# Patient Record
Sex: Male | Born: 1985 | Race: Black or African American | Hispanic: No | Marital: Married | Smoking: Never smoker
Health system: Southern US, Community
[De-identification: ages and names within clinical notes are randomized; demographics above are authoritative.]

## PROBLEM LIST (undated history)

## (undated) HISTORY — PX: TONSILLECTOMY: SUR1361

---

## 2004-11-16 ENCOUNTER — Encounter: Admission: RE | Admit: 2004-11-16 | Discharge: 2004-11-16 | Payer: Self-pay | Admitting: Occupational Medicine

## 2013-02-06 ENCOUNTER — Other Ambulatory Visit (HOSPITAL_COMMUNITY)
Admission: RE | Admit: 2013-02-06 | Discharge: 2013-02-06 | Disposition: A | Discharge status: Home / Self Care | Payer: Self-pay | Source: Ambulatory Visit | Attending: Family Medicine | Admitting: Family Medicine

## 2013-02-06 ENCOUNTER — Encounter (HOSPITAL_COMMUNITY): Payer: Self-pay | Admitting: *Deleted

## 2013-02-06 ENCOUNTER — Emergency Department (INDEPENDENT_AMBULATORY_CARE_PROVIDER_SITE_OTHER)
Admission: EM | Admit: 2013-02-06 | Discharge: 2013-02-06 | Disposition: A | Discharge status: Home / Self Care | Payer: Self-pay | Source: Home / Self Care

## 2013-02-06 DIAGNOSIS — N342 Other urethritis: Secondary | ICD-10-CM

## 2013-02-06 DIAGNOSIS — R319 Hematuria, unspecified: Secondary | ICD-10-CM

## 2013-02-06 DIAGNOSIS — Z113 Encounter for screening for infections with a predominantly sexual mode of transmission: Secondary | ICD-10-CM | POA: Insufficient documentation

## 2013-02-06 LAB — POCT URINALYSIS DIP (DEVICE)
Glucose, UA: NEGATIVE mg/dL
Nitrite: NEGATIVE
Urobilinogen, UA: 1 mg/dL (ref 0.0–1.0)

## 2013-02-06 MED ORDER — AZITHROMYCIN 250 MG PO TABS
1000.0000 mg | ORAL_TABLET | Freq: Once | ORAL | Status: AC
  Administered 2013-02-06: 1000 mg via ORAL

## 2013-02-06 MED ORDER — CEFTRIAXONE SODIUM 250 MG IJ SOLR
250.0000 mg | Freq: Once | INTRAMUSCULAR | Status: AC
  Administered 2013-02-06: 250 mg via INTRAMUSCULAR

## 2013-02-06 MED ORDER — CEFTRIAXONE SODIUM 250 MG IJ SOLR
INTRAMUSCULAR | Status: AC
  Filled 2013-02-06: qty 250

## 2013-02-06 MED ORDER — AZITHROMYCIN 250 MG PO TABS
ORAL_TABLET | ORAL | Status: AC
  Filled 2013-02-06: qty 4

## 2013-02-06 NOTE — ED Provider Notes (Signed)
   History    CSN: [id number] Arrival date & time 02/06/13  1012  First MD Initiated Contact with Patient 02/06/13 1059     Chief Complaint  Patient presents with  . Hematuria   (Consider location/radiation/quality/duration/timing/severity/associated sxs/prior Treatment) HPI  27 yo bm presents today with complaints of hematuria.  States that 2 weeks ago he started having dysuria, hematuria, and some yellowish colored penile discharge.  No discharge over the last week or so.  Has had some lower abd discomfort but this is somewhat improved.  Denies fever, chills, joint pain, weakness, fatigue, testicular pain.   History reviewed. No pertinent past medical history. History reviewed. No pertinent past surgical history. History reviewed. No pertinent family history. History  Substance Use Topics  . Smoking status: Not on file  . Smokeless tobacco: Not on file  . Alcohol Use: Yes    Review of Systems  Constitutional: Negative.   HENT: Negative.   Eyes: Negative.   Cardiovascular: Negative.   Endocrine: Negative.   Genitourinary: Positive for dysuria, frequency, hematuria and discharge. Negative for urgency, flank pain, penile swelling, scrotal swelling, genital sores, penile pain and testicular pain.  Musculoskeletal: Negative.   Skin: Negative.   Neurological: Negative.   Hematological: Negative.   Psychiatric/Behavioral: Negative.     Allergies  Review of patient's allergies indicates no known allergies.  Home Medications  No current outpatient prescriptions on file. BP 133/95  Pulse 65  Temp(Src) 98.8 F (37.1 C) (Oral)  Resp 18  SpO2 100% Physical Exam  Constitutional: He is oriented to person, place, and time. He appears well-developed and well-nourished.  HENT:  Head: Normocephalic and atraumatic.  Eyes: EOM are normal. Pupils are equal, round, and reactive to light.  Neck: Normal range of motion.  Cardiovascular: Normal rate and regular rhythm.    Pulmonary/Chest: Effort normal and breath sounds normal.  Abdominal: Soft. Bowel sounds are normal. There is no tenderness. There is no rebound and no guarding.  Genitourinary: Penis normal. No penile tenderness.  Musculoskeletal: Normal range of motion.  Neurological: He is alert and oriented to person, place, and time.  Skin: Skin is warm and dry.  Psychiatric: He has a normal mood and affect.    ED Course  Procedures (including critical care time) Labs Reviewed  POCT URINALYSIS DIP (DEVICE) - Abnormal; Notable for the following:    Hgb urine dipstick MODERATE (*)    Protein, ur 100 (*)    Leukocytes, UA SMALL (*)    All other components within normal limits  URINE CYTOLOGY ANCILLARY ONLY   No results found. No diagnosis found.  Dx:  urethrits MDM  Will treat patient for urethritis.  Cultures sent.   Instructions given.  Return 3-5 days if not resolved.    Meds ordered this encounter  Medications  . cefTRIAXone (ROCEPHIN) injection 250 mg    Sig:   . azithromycin (ZITHROMAX) tablet 1,000 mg    Sig:     Zonia Kief, PA-C 02/08/13 1521

## 2013-02-06 NOTE — ED Notes (Signed)
Pt  Reports  He  Noticed  Some   Blood  In  Urine     X  2  Weeks           With  Some low  abd  Pain  And  intermittant       Discharge              At  This  Time       Pt  denys  Any  Pain

## 2013-02-07 LAB — URINE CULTURE

## 2013-02-09 ENCOUNTER — Telehealth (HOSPITAL_COMMUNITY): Payer: Self-pay | Admitting: Surgery

## 2013-02-09 NOTE — ED Provider Notes (Signed)
Medical screening examination/treatment/procedure(s) were performed by a resident physician or non-physician practitioner and as the supervising physician I was immediately available for consultation/collaboration.  Clementeen Graham, MD   Rodolph Bong, MD 02/09/13 [phone]

## 2013-02-09 NOTE — ED Notes (Addendum)
GC and Chlamydia pos., Urine culture: No growth.  Pt. adequately treated with Rocephin and Zithromax.  I called pt. and left a message to call. DHHS forms x 2 completed and faxed to the [facility].  Vassie Moselle 02/09/2013

## 2013-02-10 NOTE — ED Notes (Signed)
Call from patient. After verifying ID, discussed positive GC and chlamydia findings . Discussed he should advise his partner(s) of infection and need to be treated . Verbalized understanding of instructions

## 2013-02-11 ENCOUNTER — Telehealth (HOSPITAL_COMMUNITY): Payer: Self-pay | Admitting: *Deleted

## 2013-02-12 ENCOUNTER — Telehealth (HOSPITAL_COMMUNITY): Payer: Self-pay | Admitting: *Deleted

## 2013-02-12 NOTE — ED Notes (Signed)
I called pt. and told him I was calling about his lab results. He said he called back and got the results.  Pt. instructed to notify his partner, no sex for 1 week and to practice safe sex. Pt. told he can get HIV testing at the [facility]. STD clinic by appointment. Vassie Moselle 02/12/2013

## 2014-05-13 ENCOUNTER — Emergency Department (HOSPITAL_COMMUNITY): Payer: 59

## 2014-05-13 ENCOUNTER — Emergency Department (HOSPITAL_COMMUNITY)
Admission: EM | Admit: 2014-05-13 | Discharge: 2014-05-13 | Disposition: A | Discharge status: Home / Self Care | Payer: 59 | Attending: Emergency Medicine | Admitting: Emergency Medicine

## 2014-05-13 ENCOUNTER — Encounter (HOSPITAL_COMMUNITY): Payer: Self-pay | Admitting: Emergency Medicine

## 2014-05-13 DIAGNOSIS — J4520 Mild intermittent asthma, uncomplicated: Secondary | ICD-10-CM | POA: Diagnosis not present

## 2014-05-13 DIAGNOSIS — R079 Chest pain, unspecified: Secondary | ICD-10-CM | POA: Diagnosis present

## 2014-05-13 DIAGNOSIS — J22 Unspecified acute lower respiratory infection: Secondary | ICD-10-CM

## 2014-05-13 LAB — BASIC METABOLIC PANEL
Anion gap: 11 (ref 5–15)
BUN: 13 mg/dL (ref 6–23)
CHLORIDE: 104 meq/L (ref 96–112)
CO2: 25 meq/L (ref 19–32)
Calcium: 9 mg/dL (ref 8.4–10.5)
Creatinine, Ser: 1.14 mg/dL (ref 0.50–1.35)
GFR calc non Af Amer: 85 mL/min — ABNORMAL LOW (ref >90)
GLUCOSE: 91 mg/dL (ref 70–99)
POTASSIUM: 4.1 meq/L (ref 3.7–5.3)
Sodium: 140 mEq/L (ref 137–147)

## 2014-05-13 LAB — CBC
HCT: 36.1 % — ABNORMAL LOW (ref 39.0–52.0)
HEMOGLOBIN: 11.2 g/dL — AB (ref 13.0–17.0)
MCH: 27.1 pg (ref 26.0–34.0)
MCHC: 31 g/dL (ref 30.0–36.0)
MCV: 87.4 fL (ref 78.0–100.0)
PLATELETS: 316 10*3/uL (ref 150–400)
RBC: 4.13 MIL/uL — AB (ref 4.22–5.81)
RDW: 13.9 % (ref 11.5–15.5)
WBC: 8.1 10*3/uL (ref 4.0–10.5)

## 2014-05-13 LAB — I-STAT TROPONIN, ED: Troponin i, poc: 0 ng/mL (ref 0.00–0.08)

## 2014-05-13 MED ORDER — METHYLPREDNISOLONE SODIUM SUCC 125 MG IJ SOLR: 125.0000 mg | Freq: Once | INTRAMUSCULAR | Status: DC

## 2014-05-13 MED ORDER — ALBUTEROL SULFATE HFA 108 (90 BASE) MCG/ACT IN AERS: 1.0000 | INHALATION_SPRAY | RESPIRATORY_TRACT | Status: DC

## 2014-05-13 MED ORDER — ALBUTEROL (5 MG/ML) CONTINUOUS INHALATION SOLN
10.0000 mg/h | INHALATION_SOLUTION | RESPIRATORY_TRACT | Status: DC
  Filled 2014-05-13: qty 20

## 2014-05-13 NOTE — Discharge Instructions (Signed)
Read the instructions below on reasons to return to the emergency department and to learn more about your diagnosis.  Use over the counter medications for symptomatic relief as we discussed (Nyquil/Dayquil)  Your more than welcome to return to the emergency department if symptoms worsen or become concerning.  Upper Respiratory Infection, Adult  An upper respiratory infection (URI) is also sometimes known as the common cold. Most people improve within 1 week, but symptoms can last up to 2 weeks. A residual cough may last even longer.   URI is most commonly caused by a virus. Viruses are NOT treated with antibiotics. You can easily spread the virus to others by oral contact. This includes kissing, sharing a glass, coughing, or sneezing. Touching your mouth or nose and then touching a surface, which is then touched by another person, can also spread the virus.   TREATMENT  Treatment is directed at relieving symptoms. There is no cure. Antibiotics are not effective, because the infection is caused by a virus, not by bacteria. Treatment may include:  Increased fluid intake. Sports drinks offer valuable electrolytes, sugars, and fluids.  Breathing heated mist or steam (vaporizer or shower).  Eating chicken soup or other clear broths, and maintaining good nutrition.  Getting plenty of rest.  Using gargles or lozenges for comfort.  Controlling fevers with ibuprofen or acetaminophen as directed by your caregiver.  Increasing usage of your inhaler if you have asthma.  Return to work when your temperature has returned to normal.   SEEK MEDICAL CARE IF:  After the first few days, you feel you are getting worse rather than better.  You develop worsening shortness of breath, or brown or red sputum. These may be signs of pneumonia.  You develop yellow or brown nasal discharge or pain in the face, especially when you bend forward. These may be signs of sinusitis.  You develop a fever, swollen neck glands, pain  with swallowing, or white areas in the back of your throat. These may be signs of strep throat.    Bronchospasm A bronchospasm is a spasm or tightening of the airways going into the lungs. During a bronchospasm breathing becomes more difficult because the airways get smaller. When this happens there can be coughing, a whistling sound when breathing (wheezing), and difficulty breathing. Bronchospasm is often associated with asthma, but not all patients who experience a bronchospasm have asthma. CAUSES  A bronchospasm is caused by inflammation or irritation of the airways. The inflammation or irritation may be triggered by:   Allergies (such as to animals, pollen, food, or mold). Allergens that cause bronchospasm may cause wheezing immediately after exposure or many hours later.   Infection. Viral infections are believed to be the most common cause of bronchospasm.   Exercise.   Irritants (such as pollution, cigarette smoke, strong odors, aerosol sprays, and paint fumes).   Weather changes. Winds increase molds and pollens in the air. Rain refreshes the air by washing irritants out. Cold air may cause inflammation.   Stress and emotional upset.  SIGNS AND SYMPTOMS   Wheezing.   Excessive nighttime coughing.   Frequent or severe coughing with a simple cold.   Chest tightness.   Shortness of breath.  DIAGNOSIS  Bronchospasm is usually diagnosed through a history and physical exam. Tests, such as chest X-rays, are sometimes done to look for other conditions. TREATMENT   Inhaled medicines can be given to open up your airways and help you breathe. The medicines can be given using  either an inhaler or a nebulizer machine.  Corticosteroid medicines may be given for severe bronchospasm, usually when it is associated with asthma. HOME CARE INSTRUCTIONS   Always have a plan prepared for seeking medical care. Know when to call your health care provider and local emergency  services (911 in the [place] Know where you can access local emergency care.  Only take medicines as directed by your health care provider.  If you were prescribed an inhaler or nebulizer machine, ask your health care provider to explain how to use it correctly. Always use a spacer with your inhaler if you were given one.  It is necessary to remain calm during an attack. Try to relax and breathe more slowly.  Control your home environment in the following ways:   Change your heating and air conditioning filter at least once a month.   Limit your use of fireplaces and wood stoves.  Do not smoke and do not allow smoking in your home.   Avoid exposure to perfumes and fragrances.   Get rid of pests (such as roaches and mice) and their droppings.   Throw away plants if you see mold on them.   Keep your house clean and dust free.   Replace carpet with wood, tile, or vinyl flooring. Carpet can trap dander and dust.   Use allergy-proof pillows, mattress covers, and box spring covers.   Wash bed sheets and blankets every week in hot water and dry them in a dryer.   Use blankets that are made of polyester or cotton.   Wash hands frequently. SEEK MEDICAL CARE IF:   You have muscle aches.   You have chest pain.   The sputum changes from clear or white to yellow, green, gray, or bloody.   The sputum you cough up gets thicker.   There are problems that may be related to the medicine you are given, such as a rash, itching, swelling, or trouble breathing.  SEEK IMMEDIATE MEDICAL CARE IF:   You have worsening wheezing and coughing even after taking your prescribed medicines.   You have increased difficulty breathing.   You develop severe chest pain. MAKE SURE YOU:   Understand these instructions.  Will watch your condition.  Will get help right away if you are not doing well or get worse. Document Released: 07/13/2003 Document Revised: 07/15/2013 Document  Reviewed: 12/30/2012 [facility] Patient Information 2015 [employer], [place]. This information is not intended to replace advice given to you by your health care provider. Make sure you discuss any questions you have with your health care provider.     Albuterol inhalation aerosol What is this medicine? ALBUTEROL (al [address] ole) is a bronchodilator. It helps open up the airways in your lungs to make it easier to breathe. This medicine is used to treat and to prevent bronchospasm. This medicine may be used for other purposes; ask your health care provider or pharmacist if you have questions. COMMON BRAND NAME(S): Proair HFA, Proventil, Proventil HFA, Respirol, Ventolin, Ventolin HFA What should I tell my health care provider before I take this medicine? They need to know if you have any of the following conditions: -diabetes -heart disease or irregular heartbeat -high blood pressure -pheochromocytoma -seizures -thyroid disease -an unusual or allergic reaction to albuterol, levalbuterol, sulfites, other medicines, foods, dyes, or preservatives -pregnant or trying to get pregnant -breast-feeding How should I use this medicine? This medicine is for inhalation through the mouth. Follow the directions on your prescription label. Take  your medicine at regular intervals. Do not use more often than directed. Make sure that you are using your inhaler correctly. Ask you doctor or health care provider if you have any questions. Talk to your pediatrician regarding the use of this medicine in children. Special care may be needed. Overdosage: If you think you have taken too much of this medicine contact a poison control center or emergency room at once. NOTE: This medicine is only for you. Do not share this medicine with others. What if I miss a dose? If you miss a dose, use it as soon as you can. If it is almost time for your next dose, use only that dose. Do not use double or extra doses. What may  interact with this medicine? -anti-infectives like chloroquine and pentamidine -caffeine -cisapride -diuretics -medicines for colds -medicines for depression or for emotional or psychotic conditions -medicines for weight loss including some herbal products -methadone -some antibiotics like clarithromycin, erythromycin, levofloxacin, and linezolid -some heart medicines -steroid hormones like dexamethasone, cortisone, hydrocortisone -theophylline -thyroid hormones This list may not describe all possible interactions. Give your health care provider a list of all the medicines, herbs, non-prescription drugs, or dietary supplements you use. Also tell them if you smoke, drink alcohol, or use illegal drugs. Some items may interact with your medicine. What should I watch for while using this medicine? Tell your doctor or health care professional if your symptoms do not improve. Do not use extra albuterol. If your asthma or bronchitis gets worse while you are using this medicine, call your doctor right away. If your mouth gets dry try chewing sugarless gum or sucking hard candy. Drink water as directed. What side effects may I notice from receiving this medicine? Side effects that you should report to your doctor or health care professional as soon as possible: -allergic reactions like skin rash, itching or hives, swelling of the face, lips, or tongue -breathing problems -chest pain -feeling faint or lightheaded, falls -high blood pressure -irregular heartbeat -fever -muscle cramps or weakness -pain, tingling, numbness in the hands or feet -vomiting Side effects that usually do not require medical attention (report to your doctor or health care professional if they continue or are bothersome): -cough -difficulty sleeping -headache -nervousness or trembling -stomach upset -stuffy or runny nose -throat irritation -unusual taste This list may not describe all possible side effects. Call  your doctor for medical advice about side effects. You may report side effects to FDA at 1-800-FDA-1088. Where should I keep my medicine? Keep out of the reach of children. Store at room temperature between 15 and 30 degrees C (59 and 86 degrees F). The contents are under pressure and may burst when exposed to heat or flame. Do not freeze. This medicine does not work as well if it is too cold. Throw away any unused medicine after the expiration date. Inhalers need to be thrown away after the labeled number of puffs have been used or by the expiration date; whichever comes first. Ventolin HFA should be thrown away 12 months after removing from foil pouch. Check the instructions that come with your medicine. NOTE: This sheet is a summary. It may not cover all possible information. If you have questions about this medicine, talk to your doctor, pharmacist, or health care provider.  2015, [employer]. (2012-12-26 10:57:17)

## 2014-05-13 NOTE — ED Provider Notes (Signed)
CSN: [id number]     Arrival date & time 05/13/14  1656 History   First MD Initiated Contact with Patient 05/13/14 1846     Chief Complaint  Patient presents with  . Chest Pain     (Consider location/radiation/quality/duration/timing/severity/associated sxs/prior Treatment) HPI  Brendan Moreno is a(n) 28 y.o. male who presents with cc of chest pain with cough. He statest that he noticed a burning sensation with inhalation the other day. He has a progressively worsening SOB over the past 4 days. He has a productive cough. And chest pain with cough. He denies a hx of asthma or smoking. He c/o severe paroxysms of cough that are worse at night. He denies fevers, chills, myalgias or malaise. He has taken nothing over the counter to treat his symptoms. He describes his shortness of breath as tightness with cough.  History reviewed. No pertinent past medical history. Past Surgical History  Procedure Laterality Date  . Tonsillectomy     No family history on file. History  Substance Use Topics  . Smoking status: Never Smoker   . Smokeless tobacco: Not on file  . Alcohol Use: Yes    Review of Systems  Ten systems reviewed and are negative for acute change, except as noted in the HPI.    Allergies  Review of patient's allergies indicates no known allergies.  Home Medications   Prior to Admission medications   Not on File   BP 130/70  Pulse 88  Temp(Src) 98.7 F (37.1 C)  Resp 21  Ht 5\' 7"  (1.702 m)  Wt 245 lb (111.131 kg)  BMI 38.36 kg/m2  SpO2 98% Physical Exam  Nursing note and vitals reviewed. Constitutional: He appears well-developed and well-nourished. No distress.  HENT:  Head: Normocephalic and atraumatic.  Eyes: Conjunctivae are normal. No scleral icterus.  Neck: Normal range of motion. Neck supple.  Cardiovascular: Normal rate, regular rhythm and normal heart sounds.   Pulmonary/Chest: Effort normal and breath sounds normal. No respiratory distress.  Abdominal:  Soft. There is no tenderness.  Musculoskeletal: He exhibits no edema.  Neurological: He is alert.  Skin: Skin is warm and dry. He is not diaphoretic.  Psychiatric: His behavior is normal.    ED Course  Procedures (including critical care time) Labs Review Labs Reviewed  CBC - Abnormal; Notable for the following:    RBC 4.13 (*)    Hemoglobin 11.2 (*)    HCT 36.1 (*)    All other components within normal limits  BASIC METABOLIC PANEL - Abnormal; Notable for the following:    GFR calc non Af Amer 85 (*)    All other components within normal limits  I-STAT TROPOININ, ED    Imaging Review Dg Chest 2 View  05/13/2014   CLINICAL DATA:  Central chest pain with cough and shortness of breath for 3 days. Nonsmoker. Initial encounter.  EXAM: CHEST  2 VIEW  COMPARISON:  Portable chest 11/16/2004.  FINDINGS: The degree of inspiration is suboptimal. Allowing for this, the heart size and mediastinal contours are stable. The lungs are clear. There is no pleural effusion or pneumothorax. No acute osseous findings are evident.  IMPRESSION: No active cardiopulmonary process.   Electronically Signed   By: Roxy HorsemanBill  Veazey M.D.   On: 05/13/2014 17:51     EKG Interpretation None      ECG interpretation   Date: 05/13/2014  Rate: 90  Rhythm: normal sinus rhythm  QRS Axis: normal  Intervals: normal  ST/T Wave abnormalities: normal  Conduction Disutrbances: none  Narrative Interpretation:   Old EKG Reviewed: none available    MDM   Final diagnoses:  Chest cold  RAD (reactive airway disease), mild intermittent, uncomplicated    Pt CXR negative for acute infiltrate. Labs unremarkable and ekg is normal.Patients symptoms are consistent with URI, likely viral etiology. Discussed that antibiotics are not indicated for viral infections. Pt will be discharged with symptomatic treatment.  Verbalizes understanding and is agreeable with plan. Pt is hemodynamically stable & in NAD prior to  dc.     Arthor CaptainAbigail Josalin Carneiro, PA-C 05/13/14 1953

## 2014-05-13 NOTE — ED Notes (Signed)
Pt states that he was walking 2 days ago when he began noticing central chest pain. No radiation reports SOB at times. NAD noted. Cough present as well states pain is worse when he coughs.

## 2014-05-14 NOTE — ED Provider Notes (Signed)
Medical screening examination/treatment/procedure(s) were performed by non-physician practitioner and as supervising physician I was immediately available for consultation/collaboration.   EKG Interpretation None      \  Enid SkeensJoshua M Haroon Shatto, MD 05/14/14 0001

## 2016-01-07 ENCOUNTER — Emergency Department (HOSPITAL_COMMUNITY)
Admission: EM | Admit: 2016-01-07 | Discharge: 2016-01-07 | Disposition: A | Discharge status: Home / Self Care | Payer: Self-pay | Attending: Emergency Medicine | Admitting: Emergency Medicine

## 2016-01-07 ENCOUNTER — Emergency Department (HOSPITAL_COMMUNITY): Payer: Self-pay

## 2016-01-07 ENCOUNTER — Encounter (HOSPITAL_COMMUNITY): Payer: Self-pay

## 2016-01-07 DIAGNOSIS — Y999 Unspecified external cause status: Secondary | ICD-10-CM | POA: Insufficient documentation

## 2016-01-07 DIAGNOSIS — W2105XA Struck by basketball, initial encounter: Secondary | ICD-10-CM | POA: Insufficient documentation

## 2016-01-07 DIAGNOSIS — Y929 Unspecified place or not applicable: Secondary | ICD-10-CM | POA: Insufficient documentation

## 2016-01-07 DIAGNOSIS — S63259A Unspecified dislocation of unspecified finger, initial encounter: Secondary | ICD-10-CM

## 2016-01-07 DIAGNOSIS — S63255A Unspecified dislocation of left ring finger, initial encounter: Secondary | ICD-10-CM | POA: Insufficient documentation

## 2016-01-07 DIAGNOSIS — Y9367 Activity, basketball: Secondary | ICD-10-CM | POA: Insufficient documentation

## 2016-01-07 MED ORDER — LIDOCAINE HCL 2 % IJ SOLN
10.0000 mL | Freq: Once | INTRAMUSCULAR | Status: AC
  Administered 2016-01-07: 200 mg
  Filled 2016-01-07: qty 20

## 2016-01-07 NOTE — ED Notes (Signed)
Per pt, Pt was playing basket ball when he dislocated his left fourth finger. Coloring noted to be WNL.

## 2016-01-07 NOTE — ED Provider Notes (Signed)
CSN: [id number]     Arrival date & time 01/07/16  1131 History  By signing my name below, I, Placido Sou, attest that this documentation has been prepared under the direction and in the presence of [employer], PA-C. Electronically Signed: Placido Sou, ED Scribe. 01/07/2016. 12:16 PM.     Chief Complaint  Patient presents with  . Dislocation    Finger   The history is provided by the patient. No language interpreter was used.   HPI Comments: Brendan Moreno is a 30 y.o. male who presents to the Emergency Department complaining of a dislocated left 4th digit which occurred earlier today. He was playing basketball and when the ball struck the affected finger experienced a sudden onset of pain and an obvious deformity. He reports associated, moderate, pain across the affected finger with mild paraesthesia. He denies taking any medications for pain management. Pt denies numbness or any other injuries.   History reviewed. No pertinent past medical history. Past Surgical History  Procedure Laterality Date  . Tonsillectomy     No family history on file. Social History  Substance Use Topics  . Smoking status: Never Smoker   . Smokeless tobacco: None  . Alcohol Use: Yes     Comment: occasionally     Review of Systems A complete 10 system review of systems was obtained and all systems are negative except as noted in the HPI and PMH.   Allergies  Review of patient's allergies indicates no known allergies.  Home Medications   Prior to Admission medications   Not on File   BP 130/92 mmHg  Pulse 59  Temp(Src) 98.4 F (36.9 C) (Oral)  Resp 16  SpO2 98%    Physical Exam  Constitutional: He is oriented to person, place, and time. He appears well-developed and well-nourished.  HENT:  Head: Normocephalic and atraumatic.  Eyes: EOM are normal.  Neck: Normal range of motion.  Cardiovascular: Normal rate and regular rhythm.   Pulmonary/Chest: Effort normal and breath sounds  normal. No respiratory distress.  Abdominal: Soft.  Musculoskeletal: Normal range of motion.  Left fourth digit: Obvious deformity of the PIP of the left fourth digit. Good capillary refill. No TTP of the metacarpals of the left hand. No TTP of the left wrist.   Neurological: He is alert and oriented to person, place, and time.  Sensation of left 4th digit intact  Skin: Skin is warm and dry.  Psychiatric: He has a normal mood and affect.  Nursing note and vitals reviewed.   ED Course  .Nerve Block Date/Time: 01/07/2016 3:45 PM Performed by: Santiago Glad Authorized by: Santiago Glad Consent: Verbal consent obtained. Risks and benefits: risks, benefits and alternatives were discussed Consent given by: patient Patient understanding: patient states understanding of the procedure being performed Patient consent: the patient's understanding of the procedure matches consent given Procedure consent: procedure consent matches procedure scheduled Imaging studies: imaging studies available Patient identity confirmed: verbally with patient Time out: Immediately prior to procedure a "time out" was called to verify the correct patient, procedure, equipment, support staff and site/side marked as required. Indications: pain relief and dislocation Nerve block body site: finger. Laterality: left Patient sedated: no Patient position: sitting Needle gauge: 25 G Location technique: anatomical landmarks Local anesthetic: lidocaine 2% without epinephrine Anesthetic total: 5 ml Outcome: pain improved Patient tolerance: Patient tolerated the procedure well with no immediate complications    DIAGNOSTIC STUDIES: Oxygen Saturation is 98% on RA, normal by my interpretation.  COORDINATION OF CARE: 12:14 PM Discussed next steps with pt including DG of the left hand and reevaluation following imaging results. Pt verbalized understanding and is agreeable with the plan.   Labs Review Labs Reviewed  - No data to display  Imaging Review Dg Finger Ring Left  01/07/2016  CLINICAL DATA:  Basketball injury.  Pain in the ring finger. EXAM: LEFT RING FINGER 2+V COMPARISON:  None. FINDINGS: Deformity of the ring finger at the PIP joint. Evidence for lateral and dorsal displacement of the PIP joint. Negative for a fracture. There appears to be soft tissue swelling along the base of the ring finger. IMPRESSION: Subluxation and near dislocation of the left ring finger PIP joint. No fracture. Electronically Signed   By: Richarda OverlieAdam  Henn M.D.   On: 01/07/2016 12:44   I have personally reviewed and evaluated these images as part of my medical decision-making.   EKG Interpretation None      MDM   Final diagnoses:  None   Patient presents today after a finger dislocation that occurred when he jammed his finger against the basketball while playing basketball.  Xray negative for fracture.  Neurovascularly intact.  Dislocation reduced in the ED without difficulty.  Full ROM of the finger after reduction.  Patient neurovascularly intact after reduction.  Stable for discharge.  Return precautions given.  I personally performed the services described in this documentation, which was scribed in my presence. The recorded information has been reviewed and is accurate.    Santiago GladHeather Davion Flannery, PA-C 01/07/16 1546  Raeford RazorStephen Kohut, MD 01/14/16 2208

## 2016-08-20 IMAGING — DX DG FINGER RING 2+V*L*
3 series · 3 of 3 positions shown · non-contrast
Comparison: None.

CLINICAL DATA: Basketball injury.  Pain in the ring finger.

EXAM:
LEFT RING FINGER 2+V

[finger ap]
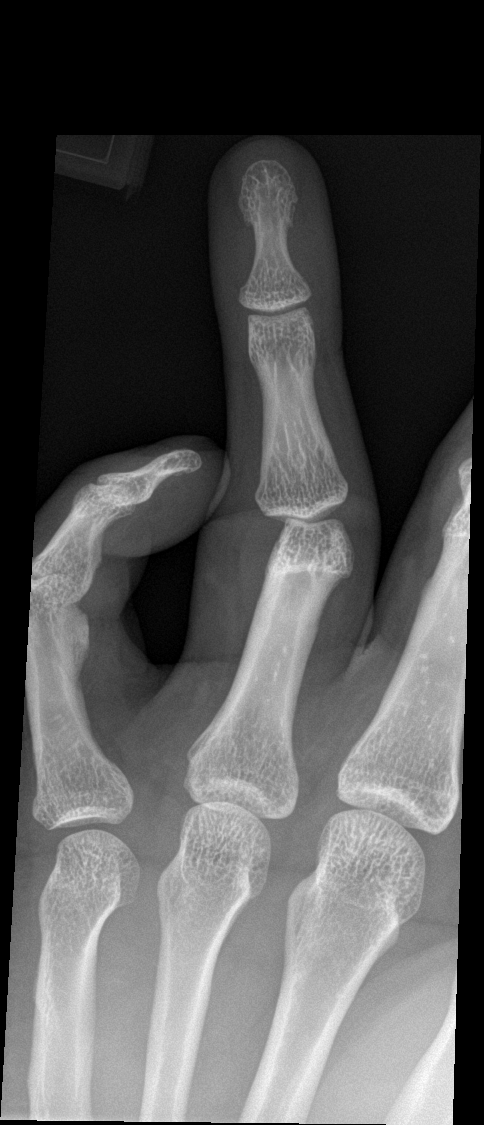

[finger obl]
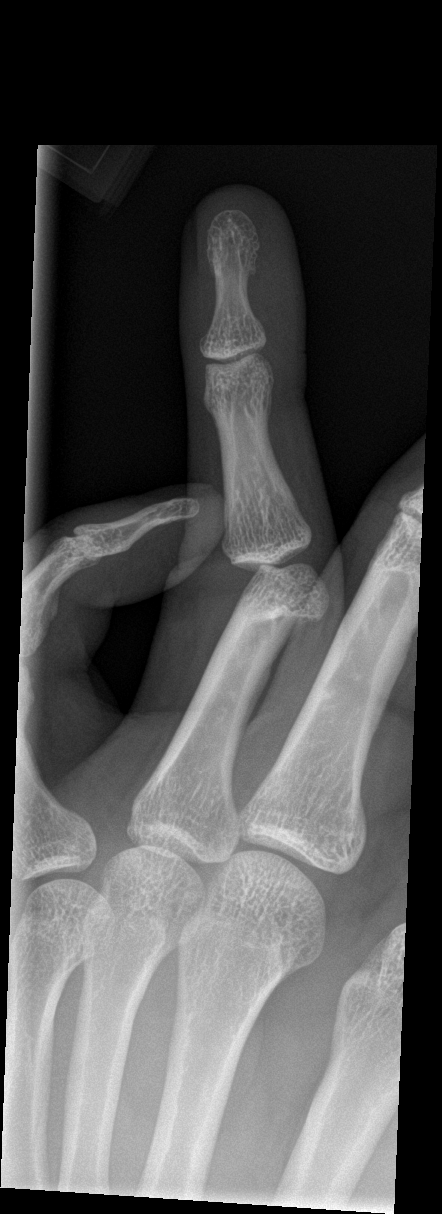

[finger lat]
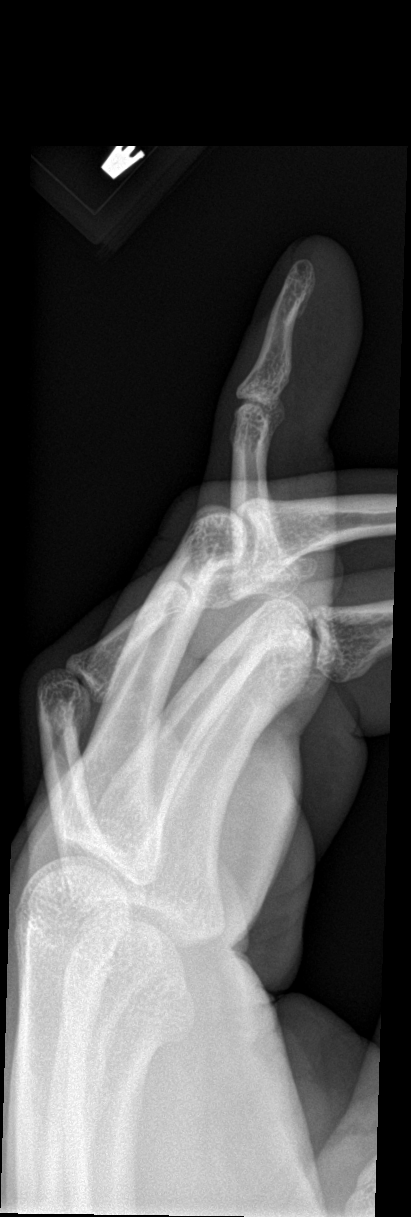

[3 of 3 positions shown; findings below may reference images not displayed]

FINDINGS: Deformity of the ring finger at the PIP joint. Evidence for lateral
and dorsal displacement of the PIP joint. Negative for a fracture.
There appears to be soft tissue swelling along the base of the ring
finger.
IMPRESSION: Subluxation and near dislocation of the left ring finger PIP joint.
No fracture.

## 2016-11-29 ENCOUNTER — Emergency Department (HOSPITAL_COMMUNITY): Payer: Self-pay

## 2016-11-29 ENCOUNTER — Encounter (HOSPITAL_COMMUNITY): Payer: Self-pay | Admitting: Emergency Medicine

## 2016-11-29 ENCOUNTER — Emergency Department (HOSPITAL_COMMUNITY)
Admission: EM | Admit: 2016-11-29 | Discharge: 2016-11-29 | Disposition: A | Discharge status: Home / Self Care | Payer: Self-pay | Attending: Emergency Medicine | Admitting: Emergency Medicine

## 2016-11-29 DIAGNOSIS — R0789 Other chest pain: Secondary | ICD-10-CM | POA: Insufficient documentation

## 2016-11-29 DIAGNOSIS — R059 Cough, unspecified: Secondary | ICD-10-CM

## 2016-11-29 DIAGNOSIS — R05 Cough: Secondary | ICD-10-CM | POA: Insufficient documentation

## 2016-11-29 LAB — I-STAT TROPONIN, ED: TROPONIN I, POC: 0 ng/mL (ref 0.00–0.08)

## 2016-11-29 MED ORDER — NAPROXEN 500 MG PO TABS: 500.0000 mg | ORAL_TABLET | Freq: Two times a day (BID) | ORAL | 0 refills | Status: AC

## 2016-11-29 MED ORDER — BENZONATATE 100 MG PO CAPS: 100.0000 mg | ORAL_CAPSULE | Freq: Three times a day (TID) | ORAL | 0 refills | Status: AC

## 2016-11-29 MED ORDER — BENZONATATE 100 MG PO CAPS
100.0000 mg | ORAL_CAPSULE | Freq: Once | ORAL | Status: AC
  Administered 2016-11-29: 100 mg via ORAL
  Filled 2016-11-29: qty 1

## 2016-11-29 MED ORDER — KETOROLAC TROMETHAMINE 30 MG/ML IJ SOLN
30.0000 mg | Freq: Once | INTRAMUSCULAR | Status: AC
  Administered 2016-11-29: 30 mg via INTRAMUSCULAR
  Filled 2016-11-29: qty 1

## 2016-11-29 NOTE — ED Triage Notes (Signed)
Pt presents to the ED with CP going on 3 days now that incr with cough. Reports non-productive cough for same time. Also presents with generalized body aches.

## 2016-11-29 NOTE — ED Notes (Signed)
Patient transported to X-ray 

## 2016-11-29 NOTE — Discharge Instructions (Signed)
You were seen today for cough and chest pain. This may be related to a virus. Your x-ray does not show pneumonia. Your chest pain is likely related to your cough. Take naproxen as needed daily. You will be given Jerilynn Somessalon Perles for your cough.

## 2016-11-29 NOTE — ED Provider Notes (Signed)
[facility]-EMERGENCY DEPT Provider Note   CSN: [id number] Arrival date & time: 11/29/16  0522     History   Chief Complaint Chief Complaint  Patient presents with  . Chest Pain  . Cough  . Generalized Body Aches    HPI Brendan Moreno is a 31 y.o. male.  HPI  This is a 31 year old male who presents with cough and chest pain. Patient reports several day history of anterior chest pain. It only occurs with coughing. Patient reports a nonproductive cough. He endorses chills without fevers. No known sick contacts. Denies any shortness of breath. He is a nonsmoker and has no history of asthma. Denies any abdominal pain, nausea, vomiting or any other symptoms. Currently his chest pain is 8 out of 10. He has not taken anything for the pain.  History reviewed. No pertinent past medical history.  There are no active problems to display for this patient.   Past Surgical History:  Procedure Laterality Date  . TONSILLECTOMY         Home Medications    Prior to Admission medications   Medication Sig Start Date End Date Taking? Authorizing Provider  benzonatate (TESSALON) 100 MG capsule Take 1 capsule (100 mg total) by mouth every 8 (eight) hours. 11/29/16   Daryus Sowash, Mayer Maskerourtney F, MD  naproxen (NAPROSYN) 500 MG tablet Take 1 tablet (500 mg total) by mouth 2 (two) times daily. 11/29/16   Tee Richeson, Mayer Maskerourtney F, MD    Family History History reviewed. No pertinent family history.  Social History Social History  Substance Use Topics  . Smoking status: Never Smoker  . Smokeless tobacco: Never Used  . Alcohol use Yes     Comment: occasionally      Allergies   Patient has no known allergies.   Review of Systems Review of Systems  Constitutional: Negative for fever.  Respiratory: Positive for cough. Negative for shortness of breath.   Cardiovascular: Positive for chest pain.  Gastrointestinal: Negative for abdominal pain, diarrhea and vomiting.  All other systems reviewed and are  negative.    Physical Exam Updated Vital Signs BP (!) 116/56   Pulse 90   Temp 98.8 F (37.1 C) (Oral)   Resp 19   Ht 5\' 7"  (1.702 m)   Wt 270 lb (122.5 kg)   SpO2 96%   BMI 42.29 kg/m   Physical Exam  Constitutional: He is oriented to person, place, and time. He appears well-developed and well-nourished.  Obese, no acute distress  HENT:  Head: Normocephalic and atraumatic.  Cardiovascular: Normal rate, regular rhythm and normal heart sounds.   No murmur heard. Pulmonary/Chest: Effort normal and breath sounds normal. No respiratory distress. He has no wheezes. He exhibits tenderness.  Anterior chest wall tenderness to palpation  Abdominal: Soft. There is no tenderness.  Musculoskeletal: He exhibits no edema.  Neurological: He is alert and oriented to person, place, and time.  Skin: Skin is warm and dry.  Psychiatric: He has a normal mood and affect.  Nursing note and vitals reviewed.    ED Treatments / Results  Labs (all labs ordered are listed, but only abnormal results are displayed) Labs Reviewed  Rosezena Sensor-STAT TROPOININ, ED    EKG  EKG Interpretation  Date/Time:  Wednesday Nov 29 2016 05:29:23 EDT Ventricular Rate:  99 PR Interval:    QRS Duration: 89 QT Interval:  343 QTC Calculation: 441 R Axis:   78 Text Interpretation:  Sinus rhythm Borderline T wave abnormalities No significant change since last tracing Confirmed  by Ross Marcus ([id number]) on 11/29/2016 5:31:30 AM       Radiology Dg Chest 2 View  Result Date: 11/29/2016 CLINICAL DATA:  Chest pain, cough, and shortness of breath for 4 days. EXAM: CHEST  2 VIEW COMPARISON:  05/13/2014 FINDINGS: The heart size and mediastinal contours are within normal limits. Both lungs are clear. The visualized skeletal structures are unremarkable. IMPRESSION: No active cardiopulmonary disease. Electronically Signed   By: Burman Nieves M.D.   On: 11/29/2016 06:12    Procedures Procedures (including critical care  time)  Medications Ordered in ED Medications  ketorolac (TORADOL) 30 MG/ML injection 30 mg (30 mg Intramuscular Given 11/29/16 0627)  benzonatate (TESSALON) capsule 100 mg (100 mg Oral Given 11/29/16 6045)     Initial Impression / Assessment and Plan / ED Course  I have reviewed the triage vital signs and the nursing notes.  Pertinent labs & imaging results that were available during my care of the patient were reviewed by me and considered in my medical decision making (see chart for details).     Patient presents with chest pain related to cough. This is been ongoing for the last 2-3 days. Pain is reproducible on exam. EKG and troponin are negative. He is otherwise low risk for ACS. Feel his chest pain is likely related to his coughing and upper respiratory infection. He was given Toradol and Tessalon Perles. Chest x-ray shows no evidence of pneumonia. Recommend supportive treatment at home. Patient states he feels improved with treatment.  After history, exam, and medical workup I feel the patient has been appropriately medically screened and is safe for discharge home. Pertinent diagnoses were discussed with the patient. Patient was given return precautions.   Final Clinical Impressions(s) / ED Diagnoses   Final diagnoses:  Cough  Chest wall pain    New Prescriptions Discharge Medication List as of 11/29/2016  7:29 AM    START taking these medications   Details  benzonatate (TESSALON) 100 MG capsule Take 1 capsule (100 mg total) by mouth every 8 (eight) hours., Starting Wed 11/29/2016, Print    naproxen (NAPROSYN) 500 MG tablet Take 1 tablet (500 mg total) by mouth 2 (two) times daily., Starting Wed 11/29/2016, Print         Orabelle Rylee, Mayer Masker, MD 11/30/16 [phone]

## 2017-07-13 IMAGING — DX DG CHEST 2V
2 series · 2 of 2 positions shown · non-contrast
Comparison: 05/13/2014

CLINICAL DATA: Chest pain, cough, and shortness of breath for 4
days.

EXAM:
CHEST  2 VIEW

[chest pa]
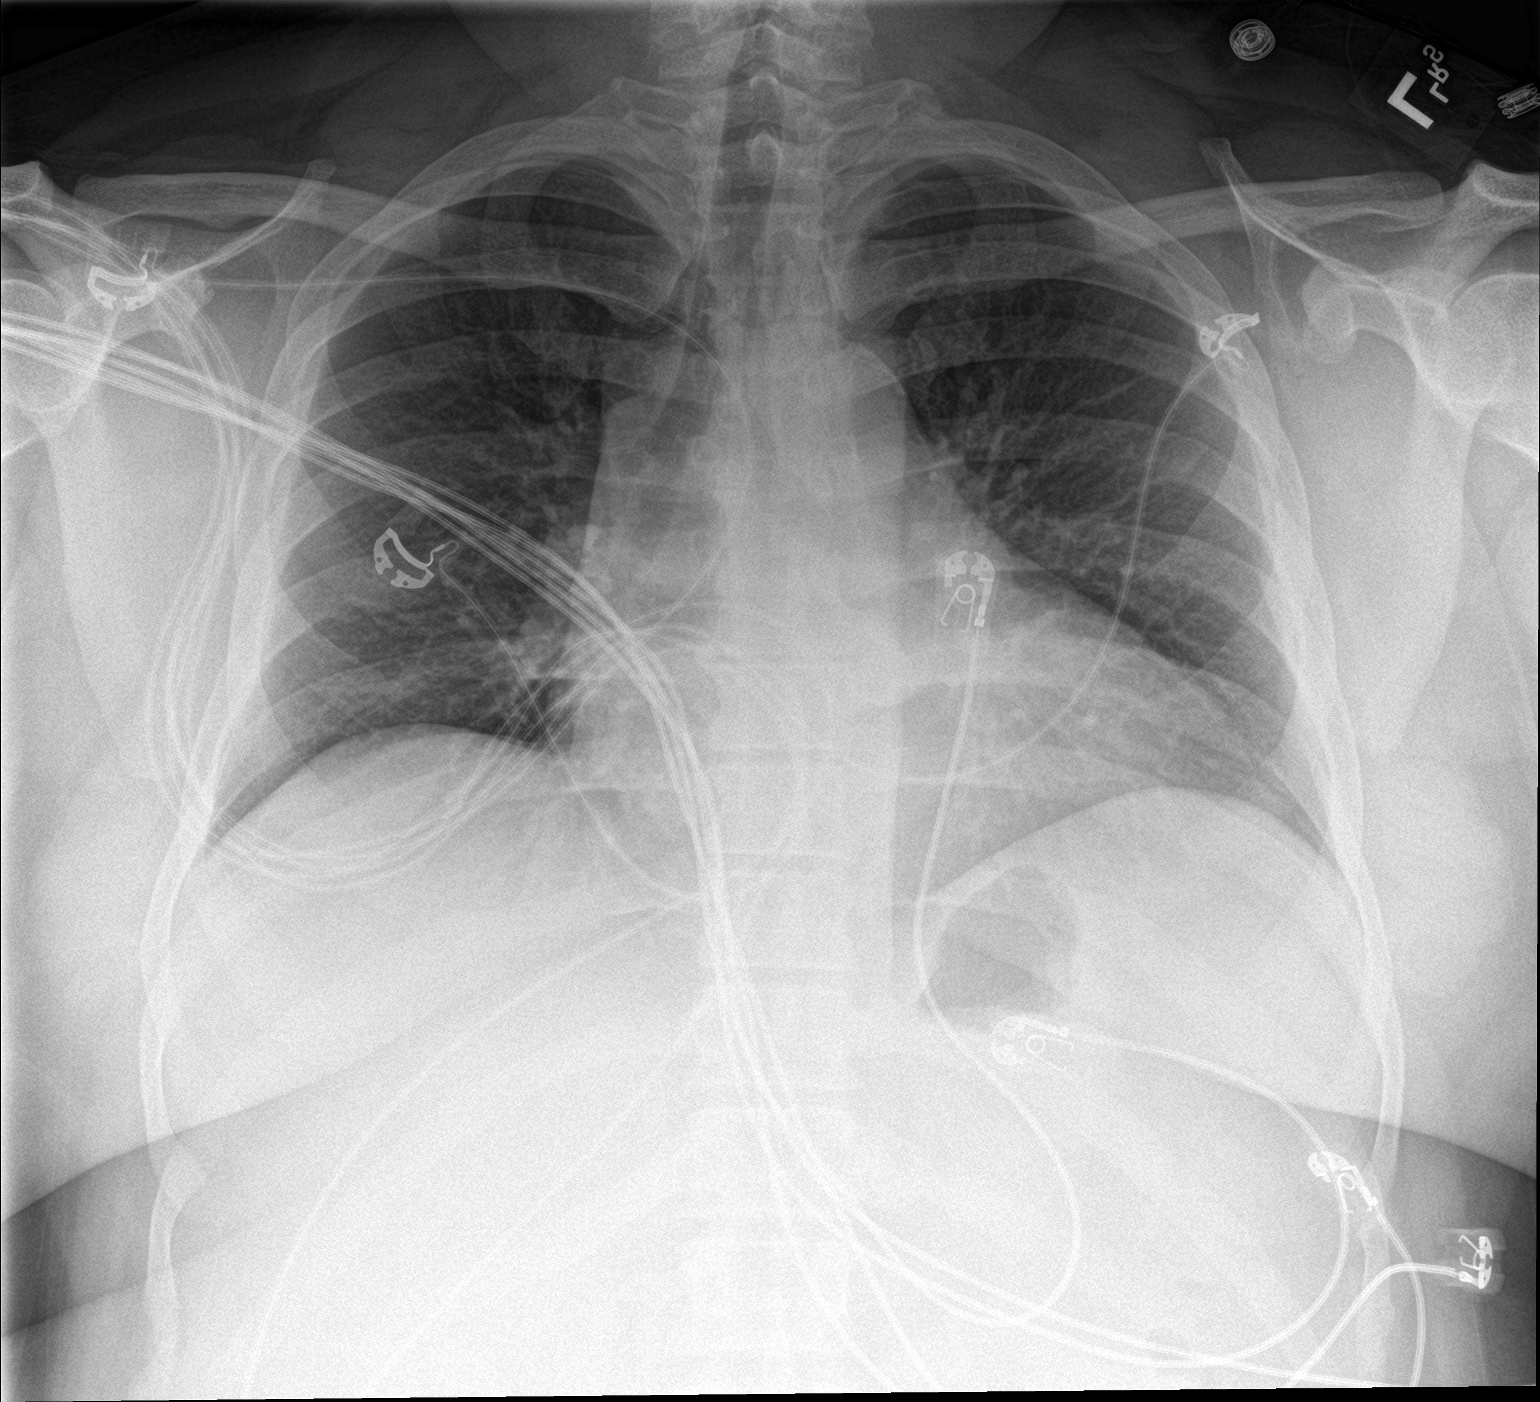

[chest lat]
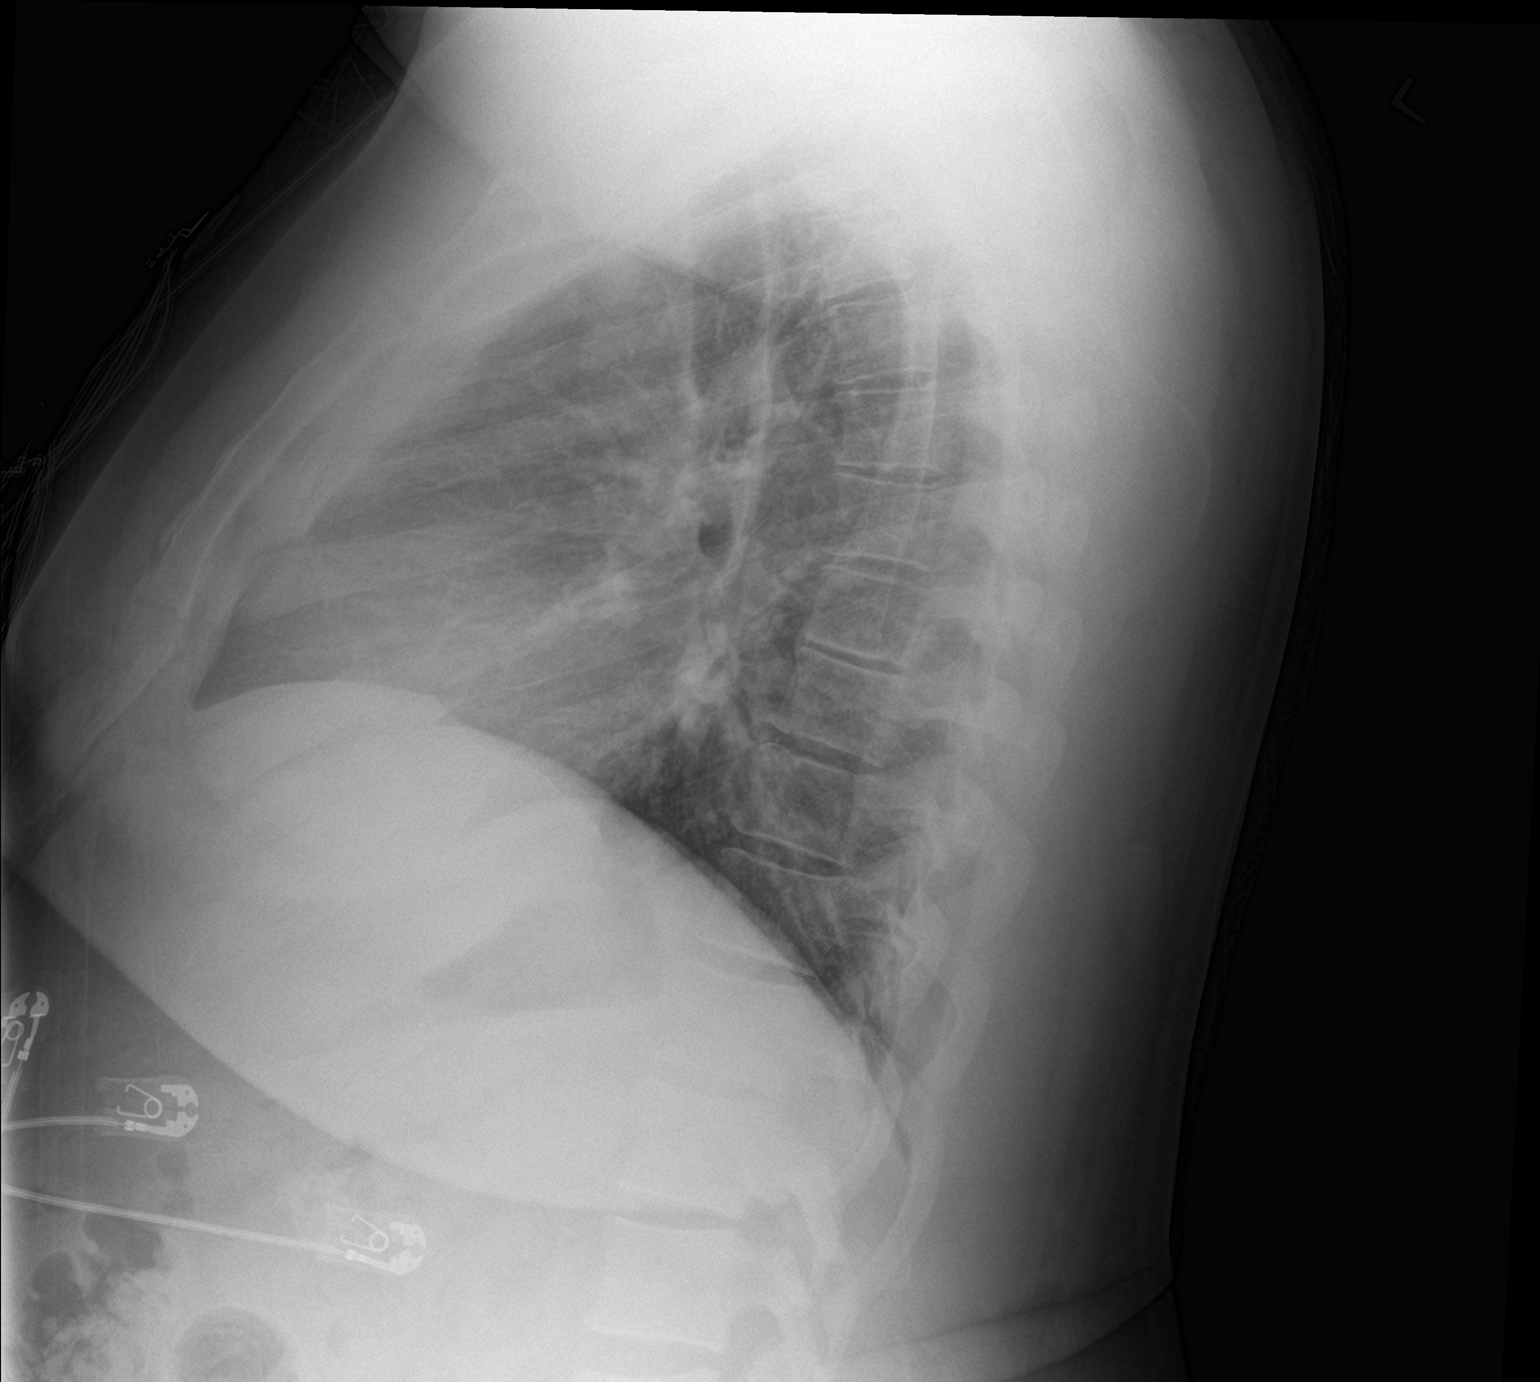

[2 of 2 positions shown; findings below may reference images not displayed]

FINDINGS: The heart size and mediastinal contours are within normal limits.
Both lungs are clear. The visualized skeletal structures are
unremarkable.
IMPRESSION: No active cardiopulmonary disease.

## 2024-06-04 ENCOUNTER — Emergency Department (HOSPITAL_COMMUNITY)
Admission: EM | Admit: 2024-06-04 | Discharge: 2024-06-05 | Disposition: A | Discharge status: Home / Self Care | Payer: Self-pay | Attending: Emergency Medicine | Admitting: Emergency Medicine

## 2024-06-04 ENCOUNTER — Emergency Department (HOSPITAL_COMMUNITY): Payer: Self-pay

## 2024-06-04 DIAGNOSIS — R10A2 Flank pain, left side: Secondary | ICD-10-CM | POA: Insufficient documentation

## 2024-06-04 DIAGNOSIS — I10 Essential (primary) hypertension: Secondary | ICD-10-CM | POA: Insufficient documentation

## 2024-06-04 DIAGNOSIS — D649 Anemia, unspecified: Secondary | ICD-10-CM | POA: Insufficient documentation

## 2024-06-04 LAB — BASIC METABOLIC PANEL WITH GFR
Anion gap: 10 (ref 5–15)
BUN: 11 mg/dL (ref 6–20)
CO2: 22 mmol/L (ref 22–32)
Calcium: 8.6 mg/dL — ABNORMAL LOW (ref 8.9–10.3)
Chloride: 104 mmol/L (ref 98–111)
Creatinine, Ser: 1.02 mg/dL (ref 0.61–1.24)
GFR, Estimated: >60 mL/min (ref >60)
Glucose, Bld: 90 mg/dL (ref 70–99)
Potassium: 4.3 mmol/L (ref 3.5–5.1)
Sodium: 136 mmol/L (ref 135–145)

## 2024-06-04 LAB — CBC WITH DIFFERENTIAL/PLATELET
Abs Immature Granulocytes: 0.02 K/uL (ref 0.00–0.07)
Basophils Absolute: 0 K/uL (ref 0.0–0.1)
Basophils Relative: 0 %
Eosinophils Absolute: 0.2 K/uL (ref 0.0–0.5)
Eosinophils Relative: 2 %
HCT: 32.6 % — ABNORMAL LOW (ref 39.0–52.0)
Hemoglobin: 9.8 g/dL — ABNORMAL LOW (ref 13.0–17.0)
Immature Granulocytes: 0 %
Lymphocytes Relative: 22 %
Lymphs Abs: 1.7 K/uL (ref 0.7–4.0)
MCH: 28.2 pg (ref 26.0–34.0)
MCHC: 30.1 g/dL (ref 30.0–36.0)
MCV: 93.9 fL (ref 80.0–100.0)
Monocytes Absolute: 0.4 K/uL (ref 0.1–1.0)
Monocytes Relative: 5 %
Neutro Abs: 5.4 K/uL (ref 1.7–7.7)
Neutrophils Relative %: 71 %
Platelets: 326 K/uL (ref 150–400)
RBC: 3.47 MIL/uL — ABNORMAL LOW (ref 4.22–5.81)
RDW: 14.6 % (ref 11.5–15.5)
WBC: 7.8 K/uL (ref 4.0–10.5)
nRBC: 0 % (ref 0.0–0.2)

## 2024-06-04 LAB — I-STAT CHEM 8, ED
BUN: 11 mg/dL (ref 6–20)
Calcium, Ion: 1.15 mmol/L (ref 1.15–1.40)
Chloride: 105 mmol/L (ref 98–111)
Creatinine, Ser: 1 mg/dL (ref 0.61–1.24)
Glucose, Bld: 92 mg/dL (ref 70–99)
HCT: 33 % — ABNORMAL LOW (ref 39.0–52.0)
Hemoglobin: 11.2 g/dL — ABNORMAL LOW (ref 13.0–17.0)
Potassium: 4.4 mmol/L (ref 3.5–5.1)
Sodium: 140 mmol/L (ref 135–145)
TCO2: 23 mmol/L (ref 22–32)

## 2024-06-04 NOTE — ED Provider Triage Note (Signed)
 Emergency Medicine Provider Triage Evaluation Note  Brendan Moreno , a 38 y.o. male  was evaluated in triage.  Pt complains of left flank pain x 1 week pain is worse with twisting moving bending.  Amount of pain is waxing and waning it does not radiate.  No history of kidney stones.  Review of Systems  Positive: Flank pain Negative: Fever vomiting  Physical Exam  BP (!) 186/105   Pulse 82   Temp (!) 97.5 F (36.4 C)   Resp 18   SpO2 98%  Gen:   Awake, no distress   Resp:  Normal effort  MSK:   Moves extremities without difficulty  Other:    Medical Decision Making  Medically screening exam initiated at 7:23 PM.  Appropriate orders placed.  Brendan Moreno was informed that the remainder of the evaluation will be completed by another provider, this initial triage assessment does not replace that evaluation, and the importance of remaining in the ED until their evaluation is complete.     Arloa Chroman, PA-C 06/04/24 1924

## 2024-06-04 NOTE — ED Triage Notes (Signed)
 Pt c/o intermittent L flank pain x1 week.  Pain score 8/10.  Denies dysuria.

## 2024-06-05 MED ORDER — CYCLOBENZAPRINE HCL 10 MG PO TABS: 10.0000 mg | ORAL_TABLET | Freq: Two times a day (BID) | ORAL | 0 refills | Status: AC | PRN

## 2024-06-05 NOTE — ED Provider Notes (Signed)
 [place] EMERGENCY DEPARTMENT AT [facility] Provider Note   CSN: [id number] Arrival date & time: 06/04/24  1733     Patient presents with: Flank Pain   Brendan Moreno is a 38 y.o. male.   presenting with right-sided back pain that has been intermittent for the past week. The pain is described as sharp and occurs particularly when transitioning from sitting to standing or after waking up, typically in the evening due to the patient's night shift work schedule. The patient denies any recent trauma, heavy lifting, or twisting motions that could have precipitated the pain. There is no radiation of the pain to the legs, buttocks, or feet. The patient reports a history of occasional burning during urination, but denies hematuria or recent urinary symptoms. The patient has no prior history of back injuries and denies any recent falls or accidents. The patient does not have a primary care physician and has not been on any regular medications, including muscle relaxers or NSAIDs. 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 The patient denies alcohol use and reports no recent illnesses. History was obtained from the patient.   Flank Pain       Prior to Admission medications   Medication Sig Start Date End Date Taking? Authorizing Provider  cyclobenzaprine (FLEXERIL) 10 MG tablet Take 1 tablet (10 mg total) by mouth 2 (two) times daily as needed for muscle spasms. 06/05/24  Yes Graeden Bitner, Selinda, MD  benzonatate  (TESSALON ) 100 MG capsule Take 1 capsule (100 mg total) by mouth every 8 (eight) hours. 11/29/16   Horton, Charmaine FALCON, MD  naproxen  (NAPROSYN ) 500 MG tablet Take 1 tablet (500 mg total) by mouth 2 (two) times daily. 11/29/16   Horton, Charmaine FALCON, MD    Allergies: Patient has no known allergies.    Review of Systems  Genitourinary:  Positive for flank pain.    Updated Vital  Signs BP (!) 158/109 (BP Location: Left Arm)   Pulse 86   Temp 97.9 F (36.6 C) (Oral)   Resp 16   SpO2 100%   Physical Exam Vitals and nursing note reviewed.  Constitutional:      Appearance: He is well-developed.  HENT:     Head: Normocephalic and atraumatic.  Cardiovascular:     Rate and Rhythm: Normal rate.  Pulmonary:     Effort: Pulmonary effort is normal. No respiratory distress.  Abdominal:     General: There is no distension.  Musculoskeletal:        General: No tenderness. Normal range of motion.     Cervical back: Normal range of motion.  Neurological:     Mental Status: He is alert.     (all labs ordered are listed, but only abnormal results are displayed) Labs Reviewed  BASIC METABOLIC PANEL WITH GFR - Abnormal; Notable for the following components:      Result Value   Calcium 8.6 (*)    All other components within normal limits  CBC WITH DIFFERENTIAL/PLATELET - Abnormal; Notable for the following components:   RBC 3.47 (*)    Hemoglobin 9.8 (*)    HCT 32.6 (*)    All other components within normal limits  I-STAT CHEM 8, ED - Abnormal; Notable for the following components:   Hemoglobin 11.2 (*)    HCT 33.0 (*)    All other components within normal limits  URINALYSIS, ROUTINE W REFLEX MICROSCOPIC  EKG: None  Radiology: CT Renal Stone Study Result Date: 06/04/2024 CLINICAL DATA:  Abdominal/flank pain.  Concern for kidney stone EXAM: CT ABDOMEN AND PELVIS WITHOUT CONTRAST TECHNIQUE: Multidetector CT imaging of the abdomen and pelvis was performed following the standard protocol without IV contrast. RADIATION DOSE REDUCTION: This exam was performed according to the departmental dose-optimization program which includes automated exposure control, adjustment of the mA and/or kV according to patient size and/or use of iterative reconstruction technique. COMPARISON:  None Available. FINDINGS: Evaluation of this exam is limited in the absence of intravenous  contrast. Lower chest: The visualized lung bases are clear. No intra-abdominal free air or free fluid. Hepatobiliary: Fatty liver. No biliary dilatation. The gallbladder is unremarkable Pancreas: Unremarkable. No pancreatic ductal dilatation or surrounding inflammatory changes. Spleen: Normal in size without focal abnormality. Adrenals/Urinary Tract: The adrenal glands unremarkable. The kidneys, visualized ureters, and urinary bladder appear unremarkable. Stomach/Bowel: There is sigmoid diverticulosis. There is no bowel obstruction or active inflammation. The appendix is normal. Vascular/Lymphatic: The abdominal aorta and IVC are unremarkable on this noncontrast CT. No portal venous gas. There is no adenopathy. Reproductive: The prostate and seminal vesicles are grossly unremarkable. Other: Small fat containing umbilical hernia. Musculoskeletal: Mild degenerative changes. No acute osseous pathology. IMPRESSION: 1. No acute intra-abdominal or pelvic pathology. No hydronephrosis or nephrolithiasis. 2. Fatty liver. 3. Sigmoid diverticulosis. No bowel obstruction. Normal appendix. Electronically Signed   By: Vanetta Chou M.D.   On: 06/04/2024 20:04     Procedures   Medications Ordered in the ED - No data to display                                  Medical Decision Making Risk Prescription drug management.   The patient presented with right-sided back pain persisting for about a week, exacerbated by movement such as sitting and standing. The patient denied any trauma, heavy lifting, or urinary symptoms like hematuria, but reported occasional burning during urination. A CT scan showed no kidney stones but revealed diverticulosis. The patient's hemoglobin was noted to be low at 9.8, with a history of intermittent rectal bleeding. The patient was advised to follow up with a GI specialist for a colonoscopy due to diverticulosis and low hemoglobin. The pain was suspected to be muscular, and muscle  relaxants and Tylenol were recommended. The patient was advised to rest, use heat, and perform stretching exercises. Follow-up with a primary care physician was recommended for blood pressure management and further evaluation of anemia.  Differential Diagnosis: Differential diagnosis includes but is not limited to: musculoskeletal pain, renal colic, diverticulitis, urinary tract infection.  Diagnostics Review  Laboratory Interpretation (Interpreted by me): Hemoglobin 9.8, indicating anemia. White blood cell count within normal limits.  Imaging Interpretation (Interpreted by me): CT scan shows diverticulosis, no evidence of kidney stones or diverticulitis.  Care significantly affected by the following chronic Conditions: Diverticulosis; likely contributing to intermittent rectal bleeding and anemia.  Care significantly affected by the following Social Determinants of Health: The patient works night shifts, which may affect sleep quality and contribute to elevated blood pressure.  Management  Medications: Muscle relaxants and Tylenol recommended for pain management.  Re-Evaluations/Course of Care: The patient was advised to monitor symptoms and return if pain worsens or if new symptoms develop. Follow-up with a primary care physician and a GI specialist was recommended.     Final diagnoses:  Left flank pain  Anemia, unspecified type  Hypertension, unspecified type    ED Discharge Orders          Ordered    cyclobenzaprine (FLEXERIL) 10 MG tablet  2 times daily PRN        06/05/24 0300    Ambulatory referral to Gastroenterology        06/05/24 0300               Mack Alvidrez, Selinda, MD 06/05/24 0425
